# Patient Record
Sex: Female | Born: 2000 | Hispanic: Yes | Marital: Single
Health system: Northeastern US, Academic
[De-identification: ages and names within clinical notes are randomized; demographics above are authoritative.]

## PROBLEM LIST (undated history)

## (undated) DIAGNOSIS — Z9109 Other allergy status, other than to drugs and biological substances: Secondary | ICD-10-CM

## (undated) DIAGNOSIS — J45909 Unspecified asthma, uncomplicated: Secondary | ICD-10-CM

## (undated) HISTORY — PX: LAPAROSCOPIC APPENDECTOMY: SHX408

## (undated) HISTORY — DX: Unspecified asthma, uncomplicated: J45.909

## (undated) HISTORY — DX: Other allergy status, other than to drugs and biological substances: Z91.09

---
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
## 2021-05-18 IMAGING — CT CT ABD-PELV W/ CM
2 of 4 series · 15 of 46 positions shown, 17 images · IV contrast (APPLIED)
Comparison: None.

CLINICAL DATA: 19-year-old with severe mid epigastric abdominal
pain radiating into the lower abdomen, associated with nausea and
leukocytosis (white blood cell count [id number]).

EXAM:
CT ABDOMEN AND PELVIS WITH CONTRAST
TECHNIQUE: Multidetector CT imaging of the abdomen and pelvis was performed
using the standard protocol following bolus administration of
intravenous contrast.
CONTRAST:  75mL OMNIPAQUE IOHEXOL 300 MG/ML IV.

[Series 2: routine abd/pel with · axial · 0.61mm/px · z∈[-389,-24]mm · 12 of 84 slices shown, 14 images]
[im 7/84  soft-tissue]
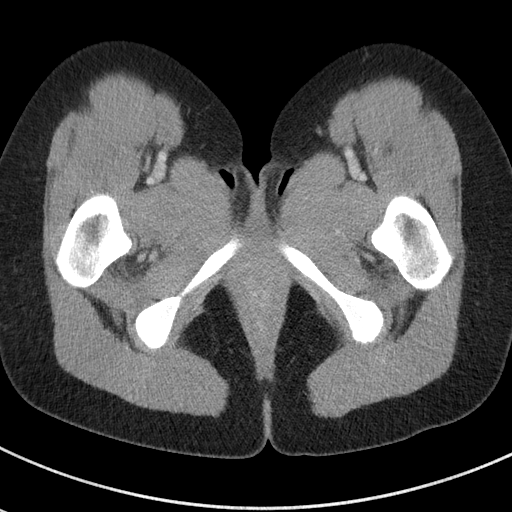
[im 7/84  bone]
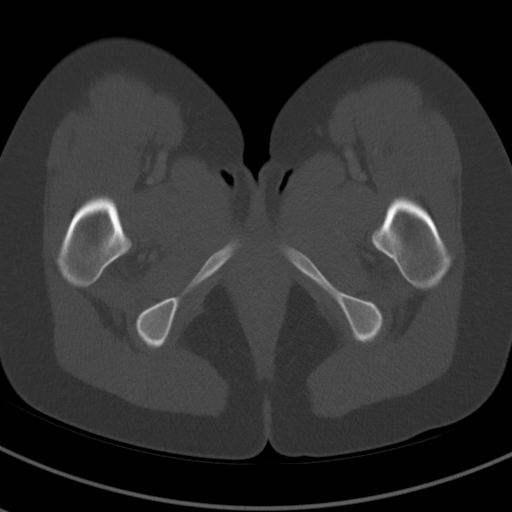
[im 14/84  soft-tissue]
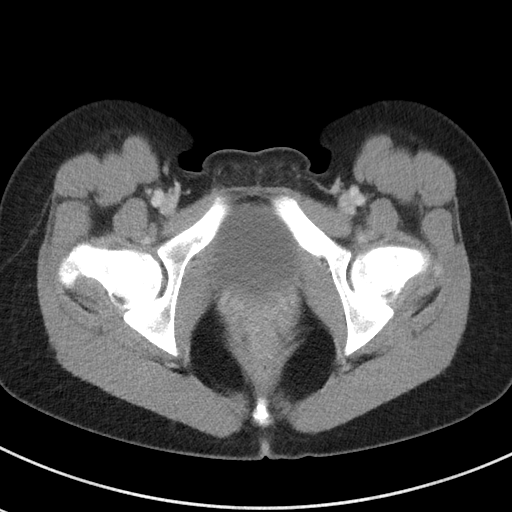
[im 20/84  soft-tissue]
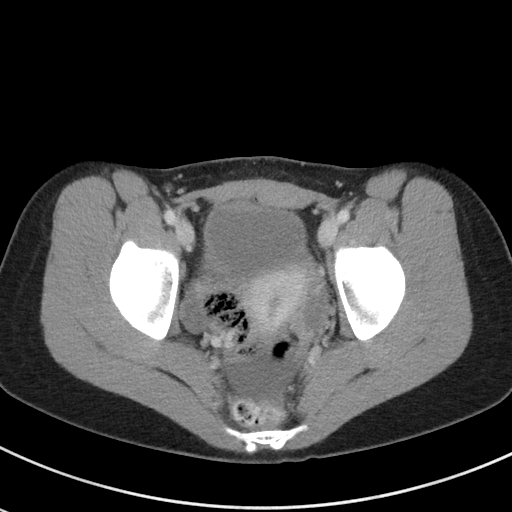
[im 27/84  soft-tissue]
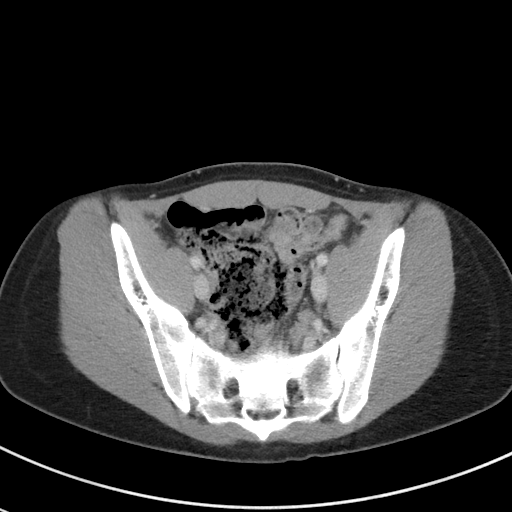
[im 34/84  soft-tissue]
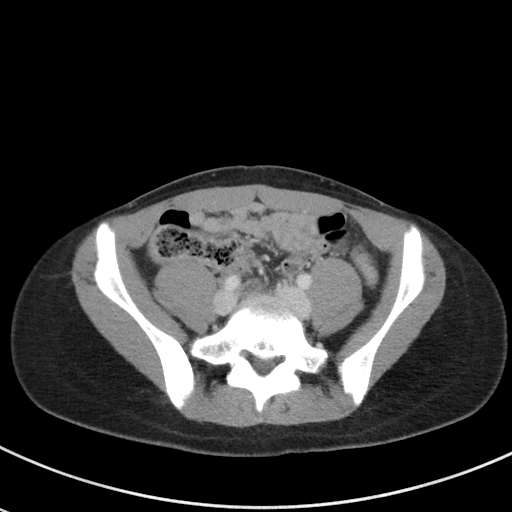
[im 40/84  soft-tissue]
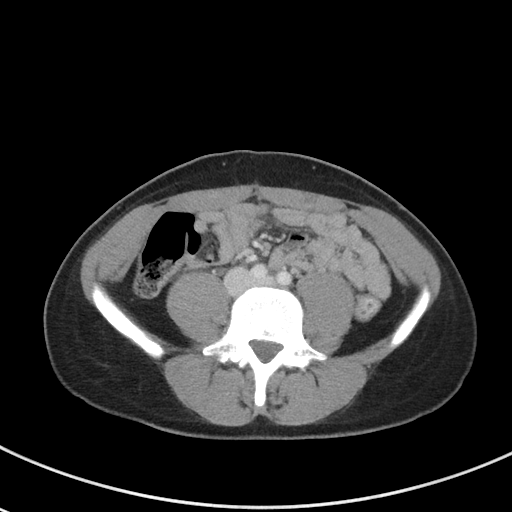
[im 47/84  soft-tissue]
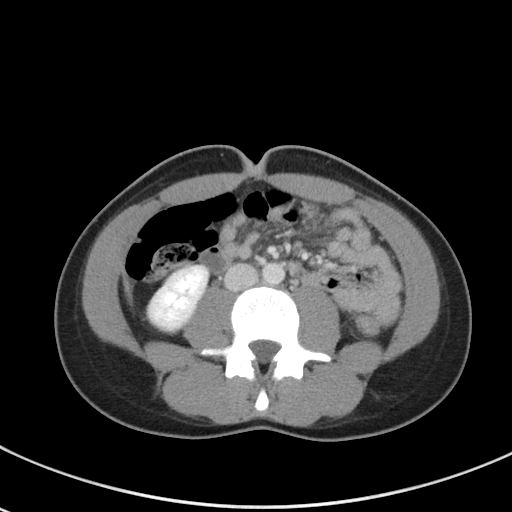
[im 54/84  soft-tissue]
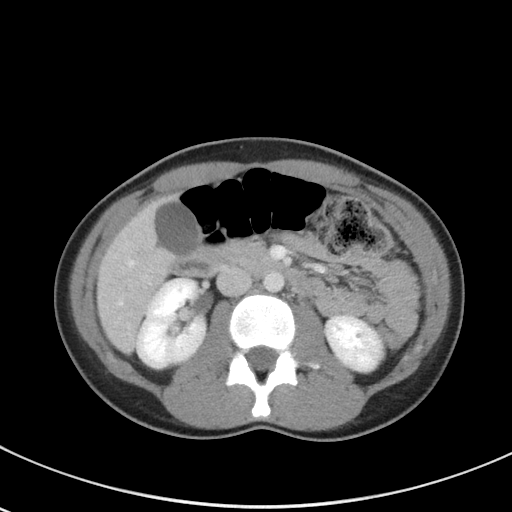
[im 60/84  soft-tissue]
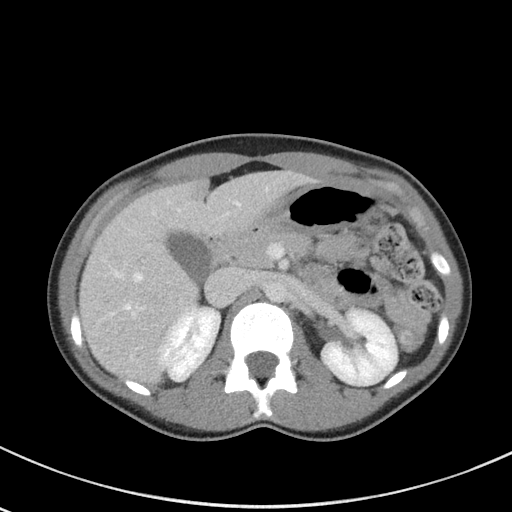
[im 60/84  bone]
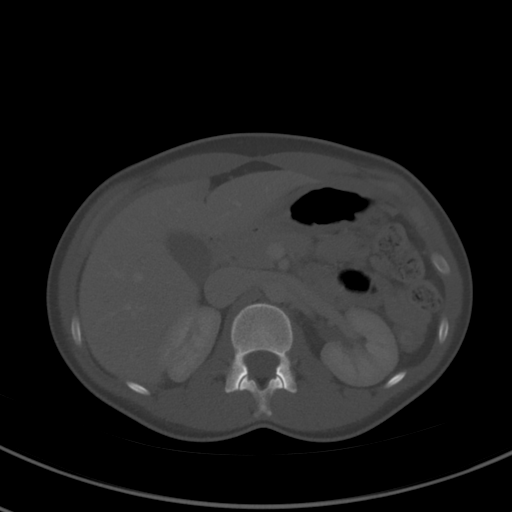
[im 67/84  soft-tissue]
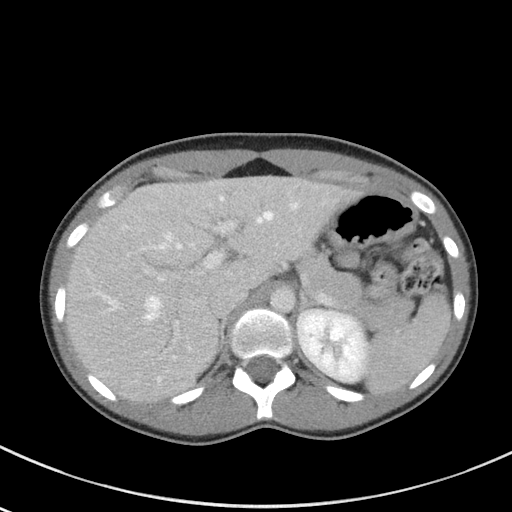
[im 74/84  soft-tissue]
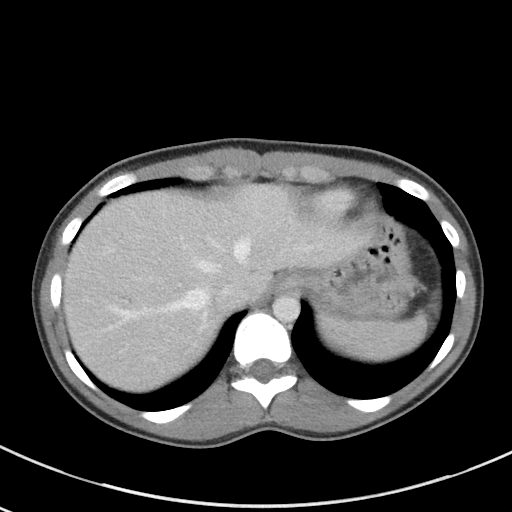
[im 80/84  soft-tissue]
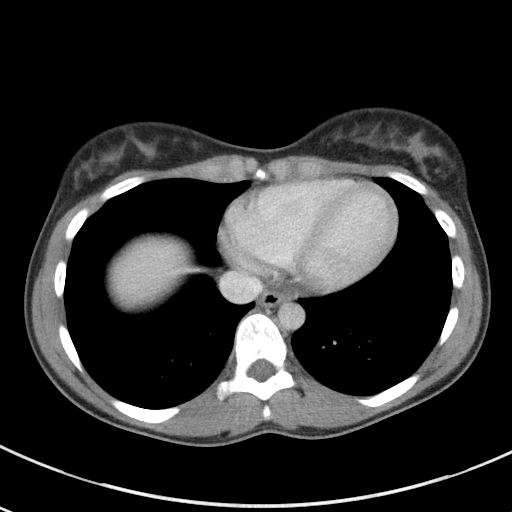

[Series 5: coronal st · coronal · 0.63mm/px · 3 of 70 slices shown]
[im 24/70  soft-tissue]
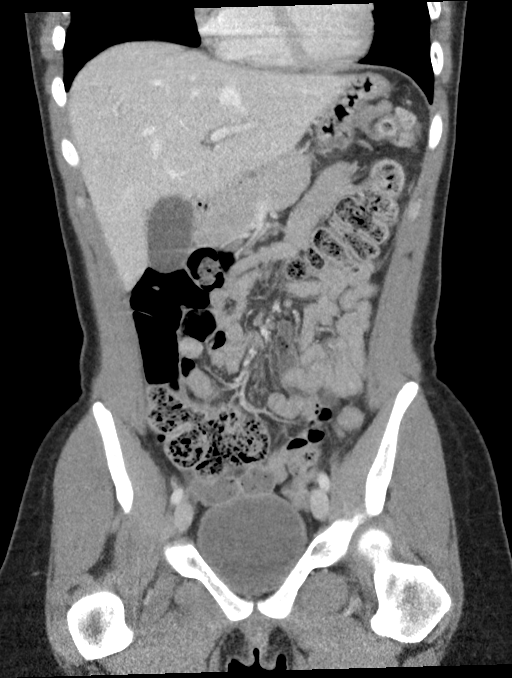
[im 31/70  soft-tissue]
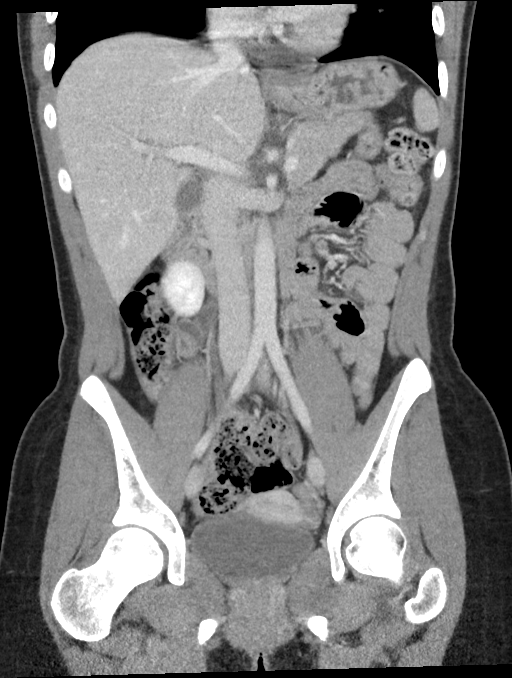
[im 39/70  soft-tissue]
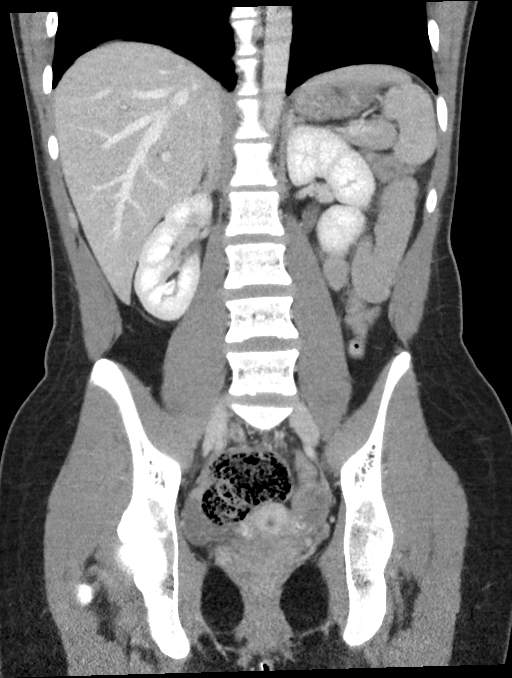

[15 of 46 positions shown; findings below may reference images not displayed]

FINDINGS: Lower chest: Heart size normal.  Visualized lung bases clear.

Hepatobiliary: Liver normal in size and appearance. Gallbladder
normal in appearance without calcified gallstones. No biliary ductal
dilation.

Pancreas: Normal in appearance without evidence of mass, ductal
dilation, or inflammation.

Spleen: Normal in size and appearance.

Adrenals/Urinary Tract: Normal appearing adrenal glands. Kidneys
normal in size and appearance without focal parenchymal abnormality.
No hydronephrosis. No evidence of urinary tract calculi. Normal
appearing urinary bladder.

Stomach/Bowel: Stomach normal in appearance for the degree of
distention. Normal-appearing small bowel. Moderate stool burden
throughout the normal appearing colon. Cecum extends low into the
RIGHT side of the pelvis. Appendix is inconspicuous, but no evidence
of pericecal inflammation.

Vascular/Lymphatic: No visible aortoiliofemoral atherosclerosis.
Widely patent visceral arteries. Normal-appearing portal venous and
systemic venous systems.

No pathologic lymphadenopathy.

Reproductive: Normal appearing uterus and ovaries. Ovaries contain
small follicular cysts. No dominant cyst.

Other: Small amount of free fluid in the cul-de-sac of the pelvis.

Musculoskeletal: Regional skeleton unremarkable without acute or
significant osseous abnormality.
IMPRESSION: 1. Small amount of free fluid in the cul-de-sac of the pelvis,
likely physiologic, though a recent ovarian cyst rupture could also
give this appearance.
2. No acute abnormalities otherwise involving the abdomen or pelvis.

## 2022-04-02 ENCOUNTER — Encounter: Payer: Self-pay | Admitting: Medical

## 2022-04-02 ENCOUNTER — Other Ambulatory Visit: Payer: Self-pay

## 2022-04-02 ENCOUNTER — Ambulatory Visit (INDEPENDENT_AMBULATORY_CARE_PROVIDER_SITE_OTHER): Payer: Medicaid - Out of State | Admitting: Medical

## 2022-04-02 VITALS — BP 108/71 | HR 81 | Temp 98.5°F | Ht 61.42 in | Wt 115.0 lb

## 2022-04-02 DIAGNOSIS — J309 Allergic rhinitis, unspecified: Secondary | ICD-10-CM

## 2022-04-02 DIAGNOSIS — R053 Chronic cough: Secondary | ICD-10-CM

## 2022-04-02 DIAGNOSIS — R058 Other specified cough: Secondary | ICD-10-CM

## 2022-04-02 DIAGNOSIS — R062 Wheezing: Secondary | ICD-10-CM

## 2022-04-02 MED ORDER — ALBUTEROL SULFATE HFA 108 (90 BASE) MCG/ACT IN AERS: 1.0000 | INHALATION_SPRAY | RESPIRATORY_TRACT | 0 refills | Status: DC | PRN

## 2022-04-02 NOTE — Progress Notes (Signed)
[name] [place], [place] [address] Phone: [phone] Fax: [phone]   Office Visit Note  Patient Name: Caitlin Gonzales  Date of [id number]  Med Rec number [id number]  Date of Service: 04/02/2022  Allergies: Patient has no known allergies.  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  Patient states she has had cough off and on for years. Feels it has been acting up in last 1-2 weeks. Also notes some wheezing. Sometimes has chest tightness. Cough sometimes wakes her up at night. Expectorating some clear mucus. Mild nasal congestion, attributes to allergies. Has been prescribed Flonase, does not like it.  Denies fever or chills. Denies sore throat. Does not really feel sick. Has been prescribed an inhaler in past. Also takes Singulair daily.   Current Medication:  Outpatient Encounter Medications as of 04/02/2022  Medication Sig   levonorgestrel-ethinyl estradiol (VIENVA) 0.1-20 MG-MCG tablet Take 1 tablet by mouth daily.   montelukast (SINGULAIR) 10 MG tablet Take 10 mg by mouth at bedtime.   [DISCONTINUED] famotidine (PEPCID) 20 MG tablet Take 1 tablet (20 mg total) by mouth 2 (two) times daily.   [DISCONTINUED] ondansetron (ZOFRAN ODT) 4 MG disintegrating tablet Take 1 tablet (4 mg total) by mouth every 8 (eight) hours as needed for nausea or vomiting.   No facility-administered encounter medications on file as of 04/02/2022.      Medical History: Environmental allergies   Vital Signs: BP 108/71   Pulse 81   Temp 98.5 F (36.9 C) (Tympanic)   Ht 5' 1.42" (1.56 m)   Wt 115 lb (52.2 kg)   SpO2 99%   BMI 21.43 kg/m    Review of Systems See HPI  Physical Exam Vitals reviewed.  Constitutional:      General: She is not in acute distress.    Appearance: She is not ill-appearing.  HENT:     Head: Normocephalic.     Right Ear: Ear canal and external ear normal.      Left Ear: Ear canal and external ear normal.     Ears:     Comments: TMs dull bilaterally, small serous middle ear fluid on left.    Nose: Mucosal edema, congestion and rhinorrhea present. Rhinorrhea is clear.     Right Turbinates: Swollen.     Left Turbinates: Swollen.     Mouth/Throat:     Mouth: Mucous membranes are moist. No oral lesions.     Pharynx: No pharyngeal swelling or posterior oropharyngeal erythema.     Tonsils: No tonsillar exudate. 0 on the right. 0 on the left.  Cardiovascular:     Rate and Rhythm: Normal rate and regular rhythm.     Heart sounds: No murmur heard.    No friction rub. No gallop.  Pulmonary:     Effort: Pulmonary effort is normal.     Breath sounds: Normal breath sounds. No wheezing, rhonchi or rales.  Musculoskeletal:     Cervical back: Neck supple. No rigidity.  Lymphadenopathy:     Cervical: No cervical adenopathy.  Neurological:     Mental Status: She is alert.       Assessment/Plan: 1. Recurrent cough 2. Allergic rhinitis, unspecified seasonality, unspecified trigger 3. Wheezing Suspect current cough is related to allergies. Advised to add daily Allegra and to try Flonase Sensimist to see she can tolerate it better. Will try albuterol inhaler as needed for cough/wheezing. Pt does  not think inhaled steroid would be covered by out-of-state Medicaid. Would consider oral steroid is sx not responding to better allergy management. May take cough suppressant as needed.  - albuterol (VENTOLIN HFA) 108 (90 Base) MCG/ACT inhaler; Inhale 1-2 puffs into the lungs every 4 (four) hours as needed for wheezing or shortness of breath (or cough).  Dispense: 1 each; Refill: 0    General Counseling: Caitlin Gonzales verbalizes understanding of the findings of todays visit and agrees with plan of treatment. she has been encouraged to call the office with any questions or concerns that should arise related to todays visit.   Time spent:20 [place]na [place] PA-C [place] 04/02/2022 11:39 AM

## 2022-04-07 NOTE — Patient Instructions (Addendum)
-  Add Allegra daily. Continue Singulair. -Try Flonase Sensimist nasal spray, 2 sprays to each nostril once a day. -Use albuterol inhaler if needed for shortness of breath, wheezing, chest tightness or persistent cough. -Rest and stay well hydrated (by drinking water and other liquids).  -Take an over-the-counter cough suppressant (i.e. Robitussin or Delsym) as needed for cough. -For your cough, use cough drops/throat lozenges, gargle warm salt water and/or drink warm liquids (like tea with honey).  -Send MyChart message to provider or schedule return visit as needed for new/worsening symptoms (i.e. increased shortness of breath or wheezing, fever) or if symptoms do not improve as discussed with recommended treatment over next 7 days. We can consider trying a steroid if your symptoms don't improve with the additional allergy medicines/inhaler.

## 2022-09-22 ENCOUNTER — Encounter: Admit: 2022-09-22 | Payer: PRIVATE HEALTH INSURANCE | Attending: Internal Medicine | Primary: Internal Medicine

## 2022-09-22 DIAGNOSIS — J45901 Unspecified asthma with (acute) exacerbation: Secondary | ICD-10-CM

## 2022-09-22 DIAGNOSIS — J302 Other seasonal allergic rhinitis: Secondary | ICD-10-CM

## 2022-10-27 ENCOUNTER — Encounter: Admit: 2022-10-27 | Payer: PRIVATE HEALTH INSURANCE | Primary: Internal Medicine

## 2022-12-02 ENCOUNTER — Ambulatory Visit (INDEPENDENT_AMBULATORY_CARE_PROVIDER_SITE_OTHER): Payer: Medicaid - Out of State | Admitting: Medical

## 2022-12-02 ENCOUNTER — Encounter: Payer: Self-pay | Admitting: Medical

## 2022-12-02 VITALS — BP 110/60 | HR 87 | Temp 98.0°F | Resp 16

## 2022-12-02 DIAGNOSIS — J069 Acute upper respiratory infection, unspecified: Secondary | ICD-10-CM | POA: Diagnosis not present

## 2022-12-02 DIAGNOSIS — J309 Allergic rhinitis, unspecified: Secondary | ICD-10-CM | POA: Diagnosis not present

## 2022-12-02 NOTE — Patient Instructions (Signed)
-  Rest and stay well hydrated (by drinking water and other liquids).  -Take over-the-counter medicines (i.e. decongestant, Ibuprofen) to help relieve your symptoms. -Continue allergy medicines. -Try Flonase/Fluticasone nasal spray, 2 sprays to each nostril once a day. -Consider trying neti pot/saline nasal rinses. -For your sore throat/cough, use cough drops/throat lozenges, gargle warm salt water and/or drink warm liquids (like tea with honey). -Send MyChart message to provider or schedule return visit as needed for new/worsening symptoms (i.e. fever, ear pain, persistent sinus pain) or if symptoms do not improve as discussed with recommended treatment over next 5-7 days.

## 2022-12-02 NOTE — Progress Notes (Signed)
[facility] [address] [place], [place] [address] Phone: [phone] Fax: [phone]   Office Visit Note  Patient Name: Caitlin Gonzales  Date of [id number]  Med Rec number [id number]  Date of Service: 12/02/2022  Allergies: Patient has no known allergies.  No chief complaint on file.    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  Sx began a week ago with sore throat. Throat feels swollen but does not look it. Developed congestion/pressure to nose, forehead and behind eyes. Occasional cough, not productive. No nasal d/c or rhinorrhea. No fever or chills. Some frontal HA. Not unusually fatigued, has been sleeping more to try to get better. Initially had ear pressure/feeling of fluid. Occasional ear pain. Ear sx slightly improved. Feels worst in AM, better after taking. No myalgias, nausea, vomiting, diarrhea.  Has year round allergies, takes Singulair and Cetirizine daily. Albuterol inhaler few days ago before going to gym.  Initially took Dayquil, switched to [employer] D last few days. Nyquil at bedtime. No nasal spray.   Hx of ear infections. Was recommended ear tubes at one point.   Current Medication:  Outpatient Encounter Medications as of 12/02/2022  Medication Sig   albuterol (VENTOLIN HFA) 108 (90 Base) MCG/ACT inhaler Inhale 1-2 puffs into the lungs every 4 (four) hours as needed for wheezing or shortness of breath (or cough).   levonorgestrel-ethinyl estradiol (VIENVA) 0.1-20 MG-MCG tablet Take 1 tablet by mouth daily.   montelukast (SINGULAIR) 10 MG tablet Take 10 mg by mouth at bedtime.   No facility-administered encounter medications on file as of 12/02/2022.      Medical History: Past Medical History:  Diagnosis Date   Asthma    Environmental allergies      Vital Signs: BP 110/60   Pulse 87   Temp 98 F (36.7 C) (Tympanic)   Resp 16   SpO2 100%    Review of Systems See HPI  Physical Exam Vitals reviewed.   Constitutional:      General: She is not in acute distress.    Comments: Tired appearing  HENT:     Head: Normocephalic.     Right Ear: Ear canal and external ear normal. Tympanic membrane is not injected, perforated, erythematous or bulging.     Left Ear: Ear canal and external ear normal. Tympanic membrane is not injected, perforated, erythematous or bulging.     Ears:     Comments: Small serous middle ear fluid bilaterally, R>L.     Nose: Mucosal edema (L>R with mild erythema), congestion (mild) and rhinorrhea present. Rhinorrhea is clear.     Right Sinus: No maxillary sinus tenderness or frontal sinus tenderness.     Left Sinus: No maxillary sinus tenderness or frontal sinus tenderness.     Mouth/Throat:     Mouth: Mucous membranes are moist. No oral lesions.     Pharynx: Posterior oropharyngeal erythema (mild) present. No pharyngeal swelling.     Tonsils: No tonsillar exudate. 0 on the right. 0 on the left.  Cardiovascular:     Rate and Rhythm: Normal rate and regular rhythm.     Heart sounds: No murmur heard.    No friction rub. No gallop.  Pulmonary:     Effort: Pulmonary effort is normal.     Breath sounds: Normal breath sounds. No wheezing, rhonchi or rales.  Musculoskeletal:     Cervical back: Neck supple. No rigidity.  Lymphadenopathy:     Cervical: Cervical adenopathy (1+ anterior nodes, nontender) present.  Neurological:     Mental Status: She is alert.     Assessment/Plan: 1. Acute upper respiratory infection 2. Allergic rhinitis, unspecified seasonality, unspecified trigger Symptoms likely due to combination of viral infection and environmental allergies. Discussed adding steroid nasal spray and OTC analgesic/anti-inflammatory as needed.  Patient Instructions  -Rest and stay well hydrated (by drinking water and other liquids).  -Take over-the-counter medicines (i.e. decongestant, Ibuprofen) to help relieve your symptoms. -Continue allergy medicines. -Try  Flonase/Fluticasone nasal spray, 2 sprays to each nostril once a day. -Consider trying neti pot/saline nasal rinses. -For your sore throat/cough, use cough drops/throat lozenges, gargle warm salt water and/or drink warm liquids (like tea with honey). -Send MyChart message to provider or schedule return visit as needed for new/worsening symptoms (i.e. fever, ear pain, persistent sinus pain) or if symptoms do not improve as discussed with recommended treatment over next 5-7 days.        General Counseling: aramis bogacz understanding of the findings of todays visit and agrees with plan of treatment. she has been encouraged to call the office with any questions or concerns that should arise related to todays visit.    Time spent:20 Minutes    Jonathon Resides PA-C [facility] 12/02/2022 9:59 AM

## 2022-12-15 ENCOUNTER — Ambulatory Visit (INDEPENDENT_AMBULATORY_CARE_PROVIDER_SITE_OTHER): Payer: Medicaid - Out of State | Admitting: Medical

## 2022-12-15 ENCOUNTER — Other Ambulatory Visit: Payer: Self-pay

## 2022-12-15 ENCOUNTER — Encounter: Payer: Self-pay | Admitting: Medical

## 2022-12-15 VITALS — BP 112/81 | HR 74 | Temp 98.5°F

## 2022-12-15 DIAGNOSIS — J019 Acute sinusitis, unspecified: Secondary | ICD-10-CM

## 2022-12-15 MED ORDER — AMOXICILLIN-POT CLAVULANATE 875-125 MG PO TABS: 1.0000 | ORAL_TABLET | Freq: Two times a day (BID) | ORAL | 0 refills | Status: DC

## 2022-12-15 NOTE — Progress Notes (Signed)
[facility] [address] [place], [place] [address] Phone: [phone] Fax: [phone]   Office Visit Note  Patient Name: Caitlin Gonzales  Date of [id number]  Med Rec number [id number]  Date of Service: 12/15/2022  Allergies: Patient has no known allergies.  Chief Complaint  Patient presents with   Acute Visit     HPI 22 y.o. college student presents with ongoing respiratory sx.  Seen on 10/29 with 1 week of congestion/sore throat. Viral infection/allergies suspected.   Sx have gotten some worse. R ear hurting some, especially when she blows nose. Noted swollen lymph node in neck on right couple days ago. Some occipital HA. Also feels pressure behind eyes. Green nasal d/c, sometimes clear. Coughing more, productive of similar mucus. Some wheezing, has been using albuterol some.  Has woken up sweating. Has not checked temp. No nausea, vomiting, diarrhea.   Taking some Allegra D off and on. Does take Zyrtec and Singulair daily. Nyquil at night. No nasal spray, does not like it.   Has had some sinus infections in past. Hx of positive EBV IgG in 2021.    Current Medication:  Outpatient Encounter Medications as of 12/15/2022  Medication Sig   albuterol (VENTOLIN HFA) 108 (90 Base) MCG/ACT inhaler Inhale 1-2 puffs into the lungs every 4 (four) hours as needed for wheezing or shortness of breath (or cough).   levonorgestrel-ethinyl estradiol (VIENVA) 0.1-20 MG-MCG tablet Take 1 tablet by mouth daily.   montelukast (SINGULAIR) 10 MG tablet Take 10 mg by mouth at bedtime.   No facility-administered encounter medications on file as of 12/15/2022.      Medical History: Past Medical History:  Diagnosis Date   Asthma    Environmental allergies      Vital Signs: BP 112/81   Pulse 74   Temp 98.5 F (36.9 C) (Tympanic)   SpO2 98%    Review of Systems See HPI  Physical Exam Vitals reviewed.  Constitutional:      General: She is not in acute  distress.    Comments: Tired appearing  HENT:     Head: Normocephalic.     Right Ear: Ear canal and external ear normal. A middle ear effusion (moderate serous) is present. Tympanic membrane is not injected, perforated, erythematous or bulging.     Left Ear: Ear canal and external ear normal. A middle ear effusion (small serous) is present. Tympanic membrane is not injected, perforated, erythematous or bulging.     Nose: Mucosal edema, congestion and rhinorrhea (cloudy) present.     Right Turbinates: Swollen (inflamed appearing).     Left Turbinates: Swollen.     Comments: Pressure with palpation over sinuses bilaterally, not pain.    Mouth/Throat:     Mouth: Mucous membranes are moist. No oral lesions.     Pharynx: No pharyngeal swelling or posterior oropharyngeal erythema.     Tonsils: No tonsillar exudate. 0 on the right. 0 on the left.  Cardiovascular:     Rate and Rhythm: Normal rate and regular rhythm.     Heart sounds: No murmur heard.    No friction rub. No gallop.  Pulmonary:     Effort: Pulmonary effort is normal.     Breath sounds: Normal breath sounds. No wheezing, rhonchi or rales.  Musculoskeletal:     Cervical back: Neck supple. No rigidity.  Lymphadenopathy:     Cervical: Cervical adenopathy (1+ anterior nodes, tender on right) present.  Neurological:     Mental Status: She is  alert.       Assessment/Plan: 1. Acute non-recurrent sinusitis, unspecified location - amoxicillin-clavulanate (AUGMENTIN) 875-125 MG tablet; Take 1 tablet by mouth 2 (two) times daily.  Dispense: 20 tablet; Refill: 0  Given persistence and progression of symptoms as well as findings on exam, will treat with antibiotic for sinusitis. Discussed ongoing supportive measures and symptomatic treatment as well.     General Counseling: natiya wexler understanding of the findings of todays visit and agrees with plan of treatment. she has been encouraged to call the office with any questions  or concerns that should arise related to todays visit.    Time spent:20 Minutes    Jonathon Resides PA-C [facility] 12/15/2022 11:28 AM

## 2022-12-15 NOTE — Patient Instructions (Signed)
-  Take complete course of antibiotics as prescribed.  Take with food.   -Rest and stay well hydrated (by drinking water and other liquids). Avoid/limit caffeine. -Take over-the-counter medicines (i.e. Sudafed, Ibuprofen, Mucinex DM) to help relieve your symptoms. -Try Flonase/Fluticasone nasal spray, 2 sprays to each nostril once a day. -Use albuterol inhaler if needed for shortness of breath, wheezing, chest tightness or persistent cough. -For your cough, use cough drops/throat lozenges, gargle warm salt water and/or drink warm liquids (like tea with honey). -Send MyChart message to provider or schedule return visit as needed for new/worsening symptoms or if symptoms do not improve as discussed with recommended/prescribed treatment.

## 2022-12-29 ENCOUNTER — Ambulatory Visit: Admit: 2022-12-29 | Payer: MEDICAID | Attending: Allergy and Immunology | Primary: Internal Medicine

## 2023-01-05 ENCOUNTER — Ambulatory Visit: Admit: 2023-01-05 | Payer: MEDICAID | Attending: Allergy and Immunology | Primary: Internal Medicine

## 2023-01-12 ENCOUNTER — Other Ambulatory Visit: Payer: Self-pay

## 2023-01-12 ENCOUNTER — Ambulatory Visit: Payer: Medicaid - Out of State | Admitting: Medical

## 2023-01-12 ENCOUNTER — Encounter: Payer: Self-pay | Admitting: Medical

## 2023-01-12 VITALS — BP 91/61 | HR 113 | Temp 101.3°F

## 2023-01-12 DIAGNOSIS — R509 Fever, unspecified: Secondary | ICD-10-CM | POA: Diagnosis not present

## 2023-01-12 DIAGNOSIS — J4520 Mild intermittent asthma, uncomplicated: Secondary | ICD-10-CM | POA: Diagnosis not present

## 2023-01-12 DIAGNOSIS — J069 Acute upper respiratory infection, unspecified: Secondary | ICD-10-CM

## 2023-01-12 LAB — POC SOFIA 2 FLU + SARS ANTIGEN FIA
Influenza A, POC: NEGATIVE
Influenza B, POC: NEGATIVE
SARS Coronavirus 2 Ag: NEGATIVE

## 2023-01-12 LAB — POCT RAPID STREP A (OFFICE): Rapid Strep A Screen: NEGATIVE

## 2023-01-12 MED ORDER — ALBUTEROL SULFATE HFA 108 (90 BASE) MCG/ACT IN AERS: 1.0000 | INHALATION_SPRAY | RESPIRATORY_TRACT | 0 refills | Status: AC | PRN

## 2023-01-12 NOTE — Progress Notes (Signed)
[facility] [address] [place], [place] [address] Phone: [phone] Fax: [phone]   Office Visit Note  Patient Name: Caitlin Gonzales  Date of ZHYQM:[id number]  Med Rec number [id number]  Date of Service: 01/12/2023  Allergies: Patient has no known allergies.  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  Only finished about 6-7 days of 10-day course of Augmentin last month for sinusitis. Sx did resolve.  Current sx began 3 nights ago - sore throat, postnasal drip, rhinorrhea, nasal congestion and HA. Myalgias in neck and back and HA noted 2 nights ago. Sore throat off and on. Also has cough, thinks drier but hurts neck/head when she coughs. Some nausea 1st night, no vomiting or diarrhea. Chills last night. Was not aware of fever.  Some wheezing. Lost her inhaler.   No known exposures. 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   Taking Dayquil and Nyquil. No meds today so far. Has no exams this week, has papers/assignments to turn in over next 2-3 days.    Current Medication:  Outpatient Encounter Medications as of 01/12/2023  Medication Sig   albuterol (VENTOLIN HFA) 108 (90 Base) MCG/ACT inhaler Inhale 1-2 puffs into the lungs every 4 (four) hours as needed for wheezing or shortness of breath (or cough).   cetirizine (ZYRTEC) 10 MG tablet Take 10 mg by mouth daily.   levonorgestrel-ethinyl estradiol (VIENVA) 0.1-20 MG-MCG tablet Take 1 tablet by mouth daily.   montelukast (SINGULAIR) 10 MG tablet Take 10 mg by mouth at bedtime.   [DISCONTINUED] amoxicillin-clavulanate (AUGMENTIN) 875-125 MG tablet Take 1 tablet by mouth 2 (two) times daily. (Patient not taking: Reported on 01/12/2023)   No facility-administered encounter medications on file as of 01/12/2023.      Medical History: Past Medical History:  Diagnosis Date   Asthma    Environmental allergies      Vital  Signs: BP 91/61   Pulse (!) 113   Temp (!) 101.3 F (38.5 C) (Tympanic)   SpO2 99%    Review of Systems See HPI  Physical Exam Vitals reviewed.  Constitutional:      General: She is not in acute distress.    Appearance: She is ill-appearing (mildly).  HENT:     Head: Normocephalic.     Right Ear: Ear canal and external ear normal.     Left Ear: Ear canal and external ear normal.     Ears:     Comments: TMs dull bilaterally, small serous middle ear fluid    Nose: Mucosal edema, congestion and rhinorrhea present. Rhinorrhea is clear.     Mouth/Throat:     Mouth: Mucous membranes are moist. No oral lesions.     Pharynx: Uvula midline. Posterior oropharyngeal erythema present. No pharyngeal swelling or uvula swelling.     Tonsils: No tonsillar exudate. 1+ on the right. 1+ on the left.  Cardiovascular:     Rate and Rhythm: Regular rhythm. Tachycardia present.     Heart sounds: No murmur heard.    No friction rub. No gallop.  Pulmonary:     Effort: Pulmonary effort is normal.     Breath sounds: Normal breath sounds. No wheezing, rhonchi or rales.  Musculoskeletal:     Cervical back: Neck supple. No rigidity.  Lymphadenopathy:     Cervical: Cervical adenopathy (1-2+ anterior nodes, tender. 1+ posterior nodes, mildly tender) present.  Neurological:  Mental Status: She is alert.     Results for orders placed or performed in visit on 01/12/23 (from the past 24 hour(s))  POC SOFIA 2 FLU + SARS ANTIGEN FIA     Status: Normal   Collection Time: 01/12/23  9:57 AM  Result Value Ref Range   Influenza A, POC Negative Negative   Influenza B, POC Negative Negative   SARS Coronavirus 2 Ag Negative Negative  POCT rapid strep A     Status: Normal   Collection Time: 01/12/23  9:57 AM  Result Value Ref Range   Rapid Strep A Screen Negative Negative     Assessment/Plan: 1. Acute upper respiratory infection (Primary)  - POC SOFIA 2 FLU + SARS ANTIGEN FIA - POCT rapid strep A -  CBC with Differential/Platelet - EPSTEIN-BARR VIRUS (EBV) Antibody Profile  2. Mild intermittent asthma, unspecified whether complicated  - albuterol (VENTOLIN HFA) 108 (90 Base) MCG/ACT inhaler; Inhale 1-2 puffs into the lungs every 4 (four) hours as needed for wheezing or shortness of breath (or cough).  Dispense: 1 each; Refill: 0  3. Fever, unspecified fever cause  - POC SOFIA 2 FLU + SARS ANTIGEN FIA - POCT rapid strep A - CBC with Differential/Platelet - EPSTEIN-BARR VIRUS (EBV) Antibody Profile      Most likely viral. POC flu/COVID-19/strep antigen testing negative. Will check labs to better characterize infection and direct treatment. Discussed supportive measures and OTC symptomatic treatment.  Patient Instructions  -Rest and stay well hydrated (by drinking water and other liquids). Avoid/limit caffeine. -Take over-the-counter medicines (i.e. Dayquil, Nyquil, Ibuprofen) to help relieve your symptoms. -For your sore throat/cough, use cough drops/throat lozenges, gargle warm salt water and/or drink warm liquids (like tea with honey). -Use albuterol inhaler if needed for shortness of breath, wheezing, chest tightness or persistent cough.  -You will receive a MyChart message notifying you of your lab results when they are available.  -Send MyChart message to provider or schedule return visit in meantime as needed for new/worsening symptoms (i.e. vomiting, increasing throat pain, shortness of breath).   General Counseling: valisha heslin understanding of the findings of todays visit and agrees with plan of treatment. she has been encouraged to call the office with any questions or concerns that should arise related to todays visit.    Time spent:23 Minutes    Jonathon Resides PA-C [facility] 01/12/2023 9:35 AM

## 2023-01-13 LAB — CBC WITH DIFFERENTIAL/PLATELET
Basophils Absolute: 0 10*3/uL (ref 0.0–0.2)
Basos: 1 %
EOS (ABSOLUTE): 0.1 10*3/uL (ref 0.0–0.4)
Eos: 1 %
Hematocrit: 40.8 % (ref 34.0–46.6)
Hemoglobin: 13.4 g/dL (ref 11.1–15.9)
Immature Grans (Abs): 0 10*3/uL (ref 0.0–0.1)
Immature Granulocytes: 0 %
Lymphocytes Absolute: 1.3 10*3/uL (ref 0.7–3.1)
Lymphs: 16 %
MCH: 28.8 pg (ref 26.6–33.0)
MCHC: 32.8 g/dL (ref 31.5–35.7)
MCV: 88 fL (ref 79–97)
Monocytes Absolute: 0.7 10*3/uL (ref 0.1–0.9)
Monocytes: 9 %
Neutrophils Absolute: 6 10*3/uL (ref 1.4–7.0)
Neutrophils: 73 %
Platelets: 314 10*3/uL (ref 150–450)
RBC: 4.65 x10E6/uL (ref 3.77–5.28)
RDW: 12.1 % (ref 11.7–15.4)
WBC: 8.3 10*3/uL (ref 3.4–10.8)

## 2023-01-13 LAB — EPSTEIN-BARR VIRUS (EBV) ANTIBODY PROFILE
EBV NA IgG: 281 U/mL — ABNORMAL HIGH (ref 0.0–17.9)
EBV VCA IgG: 60 U/mL — ABNORMAL HIGH (ref 0.0–17.9)
EBV VCA IgM: <36 U/mL (ref 0.0–35.9)

## 2023-01-18 NOTE — Patient Instructions (Signed)
-  Rest and stay well hydrated (by drinking water and other liquids). Avoid/limit caffeine. -Take over-the-counter medicines (i.e. Dayquil, Nyquil, Ibuprofen) to help relieve your symptoms. -For your sore throat/cough, use cough drops/throat lozenges, gargle warm salt water and/or drink warm liquids (like tea with honey). -Use albuterol inhaler if needed for shortness of breath, wheezing, chest tightness or persistent cough.  -You will receive a MyChart message notifying you of your lab results when they are available.  -Send MyChart message to provider or schedule return visit in meantime as needed for new/worsening symptoms (i.e. vomiting, increasing throat pain, shortness of breath).

## 2023-03-24 ENCOUNTER — Ambulatory Visit: Payer: Medicaid - Out of State | Admitting: Physician Assistant

## 2023-03-24 VITALS — BP 106/74 | HR 99 | Temp 97.5°F | Ht 61.0 in | Wt 122.0 lb

## 2023-03-24 DIAGNOSIS — J3489 Other specified disorders of nose and nasal sinuses: Secondary | ICD-10-CM

## 2023-03-24 DIAGNOSIS — H6690 Otitis media, unspecified, unspecified ear: Secondary | ICD-10-CM

## 2023-03-24 DIAGNOSIS — H6591 Unspecified nonsuppurative otitis media, right ear: Secondary | ICD-10-CM | POA: Diagnosis not present

## 2023-03-24 MED ORDER — PSEUDOEPH-BROMPHEN-DM 30-2-10 MG/5ML PO SYRP: 10.0000 mL | ORAL_SOLUTION | ORAL | 0 refills | Status: AC | PRN

## 2023-03-24 NOTE — Progress Notes (Signed)
[facility] [address] [place], [place] [address] Phone: [phone] Fax: [phone]   Office Visit Note  Patient Name: Caitlin Gonzales  Date of [id number]  Med Rec number [id number]  Date of Service: 03/24/2023  Patient has no known allergies.  No chief complaint on file.    HPI The patient initially experienced symptoms consistent with a cold, including ear stuffiness and voice loss for a few days. Over the past week, congestion has worsened, and last night, they were unable to sleep due to what felt like a migraine. Symptoms have persisted for 7-8 days without fever. They have tried NyQuil and DayQuil with no significant relief and recently attempted Allegra-D, which did not improve congestion but did help restore their voice.     Current Medication:  Outpatient Encounter Medications as of 03/24/2023  Medication Sig   albuterol (VENTOLIN HFA) 108 (90 Base) MCG/ACT inhaler Inhale 1-2 puffs into the lungs every 4 (four) hours as needed for wheezing or shortness of breath (or cough).   cetirizine (ZYRTEC) 10 MG tablet Take 10 mg by mouth daily.   levonorgestrel-ethinyl estradiol (VIENVA) 0.1-20 MG-MCG tablet Take 1 tablet by mouth daily.   montelukast (SINGULAIR) 10 MG tablet Take 10 mg by mouth at bedtime.   No facility-administered encounter medications on file as of 03/24/2023.      Medical History: Past Medical History:  Diagnosis Date   Asthma    Environmental allergies      Vital Signs: BP 106/74   Pulse 99   Temp (!) 97.5 F (36.4 C)   Ht 5\' 1"  (1.549 m)   Wt 122 lb (55.3 kg)   SpO2 99%   BMI 23.05 kg/m    Review of Systems  Physical Exam Constitutional:      Appearance: Normal appearance.  HENT:     Head: Normocephalic and atraumatic.     Ears:     Comments: R: Otitis Media w/ Effusion    Nose:     Comments: Sinus pain w/ palpation. Cardiovascular:     Rate and Rhythm: Normal rate and regular rhythm.  Pulmonary:      Effort: Pulmonary effort is normal.     Breath sounds: Normal breath sounds.  Neurological:     Mental Status: She is alert.       Assessment/Plan: The patient presents with 7-8 days of ear pressure and sinus pain without fever. Vitals are reassuring. On exam, findings are consistent with otitis media with effusion. A prescription decongestant will be initiated to aid in symptom relief. Given that she is approaching the 10-day threshold for antibiotic consideration in sinusitis, follow-up is planned in 48 hours to reassess symptoms. If symptoms persist or worsen, antibiotic therapy will be considered. The patient is agreeable to the plan, and precautions have been reviewed.  General Counseling: Caitlin Gonzales understanding of the findings of todays visit and agrees with plan of treatment. I have discussed any further diagnostic evaluation that may be needed or ordered today. We also reviewed her medications today. she has been encouraged to call the office with any questions or concerns that should arise related to todays visit.   No orders of the defined types were placed in this encounter.   No orders of the defined types were placed in this encounter.   Time spent:20 Minutes Time spent includes review of chart, medications, test results, and follow up plan with the patient.    Leroy Kennedy, PA-C [facility]

## 2023-03-25 MED ORDER — AMOXICILLIN-POT CLAVULANATE 875-125 MG PO TABS: 1.0000 | ORAL_TABLET | Freq: Two times a day (BID) | ORAL | 0 refills | Status: AC
